# Patient Record
Sex: Female | Born: 1965 | Race: White | Hispanic: No
Health system: Southern US, Community
[De-identification: ages and names within clinical notes are randomized; demographics above are authoritative.]

---

## 2008-08-03 ENCOUNTER — Encounter: Admission: RE | Admit: 2008-08-03 | Discharge: 2008-08-03 | Payer: Self-pay | Admitting: Obstetrics and Gynecology

## 2010-02-03 ENCOUNTER — Encounter: Payer: Self-pay | Admitting: Obstetrics

## 2010-07-10 IMAGING — MG MM DIGITAL DIAGNOSTIC BILAT CAD
6 series · 6 of 6 positions shown · non-contrast
Comparison: 12/18/2004 screening mammogram from [facility].

CLINICAL DATA: Palpable nodule located within the lateral portion
of the right breast.

DIGITAL DIAGNOSTIC  BILATERAL  MAMMOGRAM  WITH CAD AND RIGHT BREAST
ULTRASOUND:

[R CC]
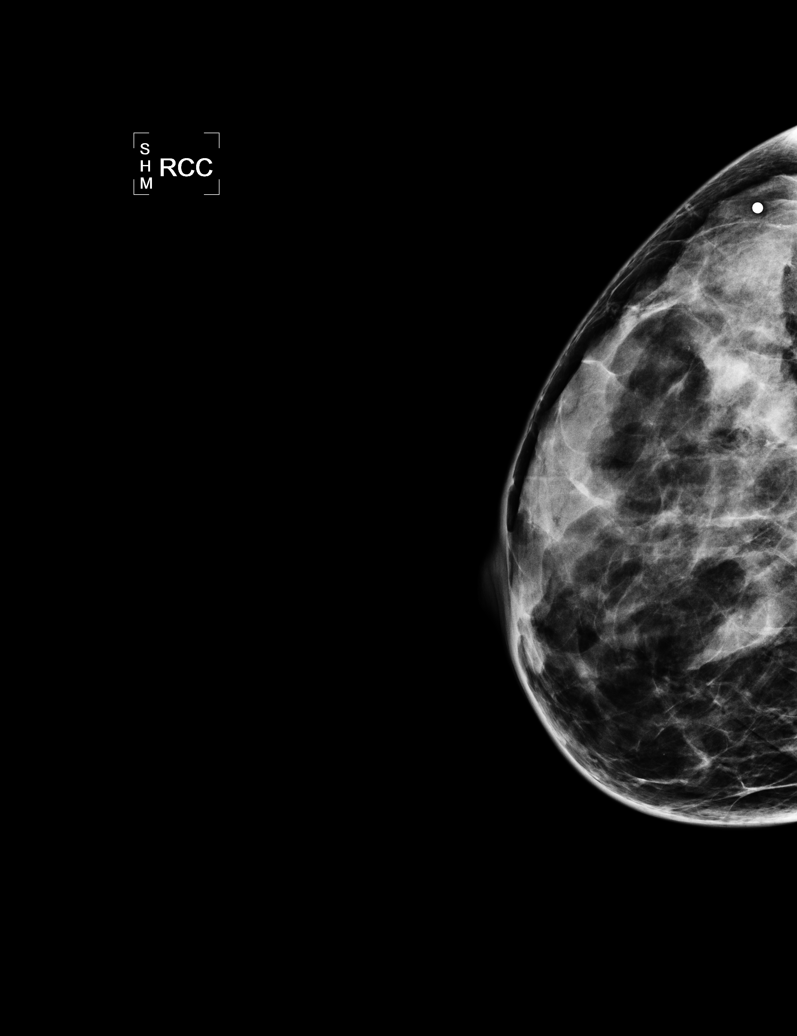

[L CC]
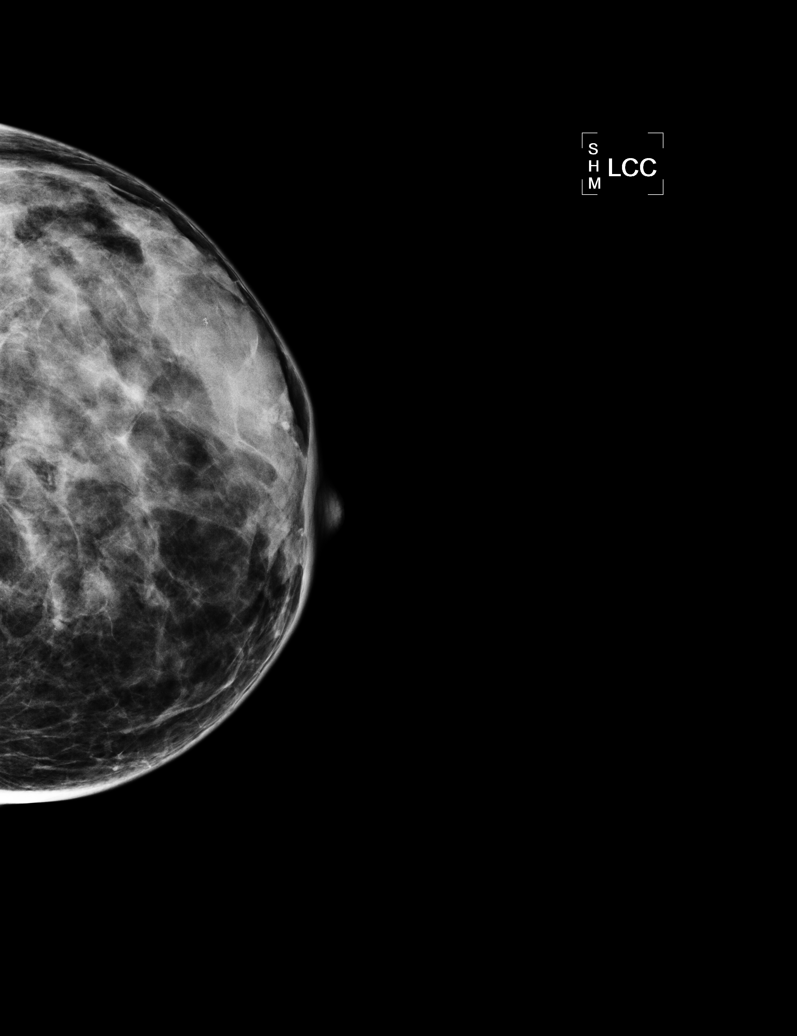

[L MLO]
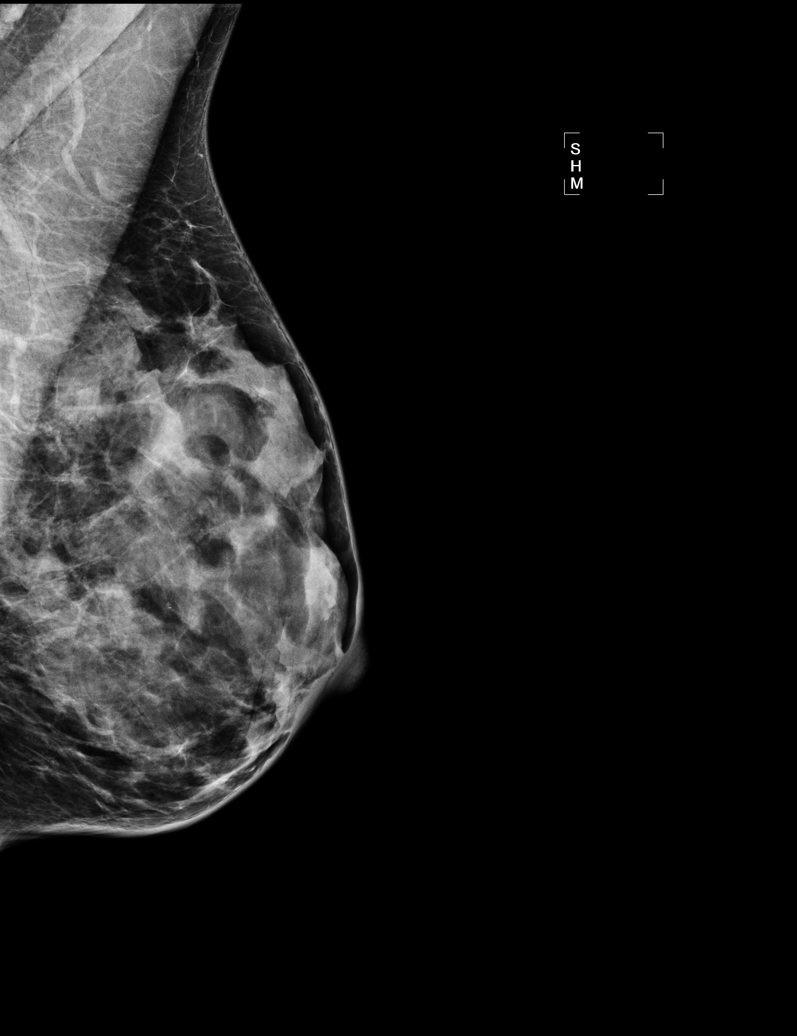

[R MLO]
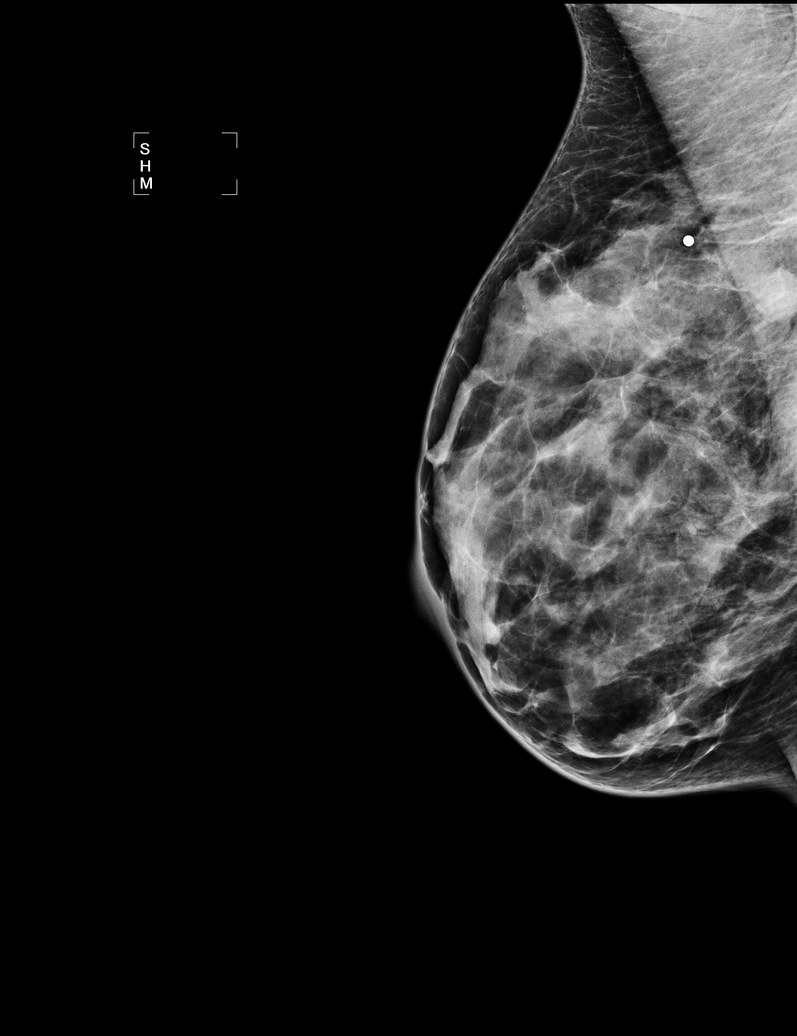

[R TAN]
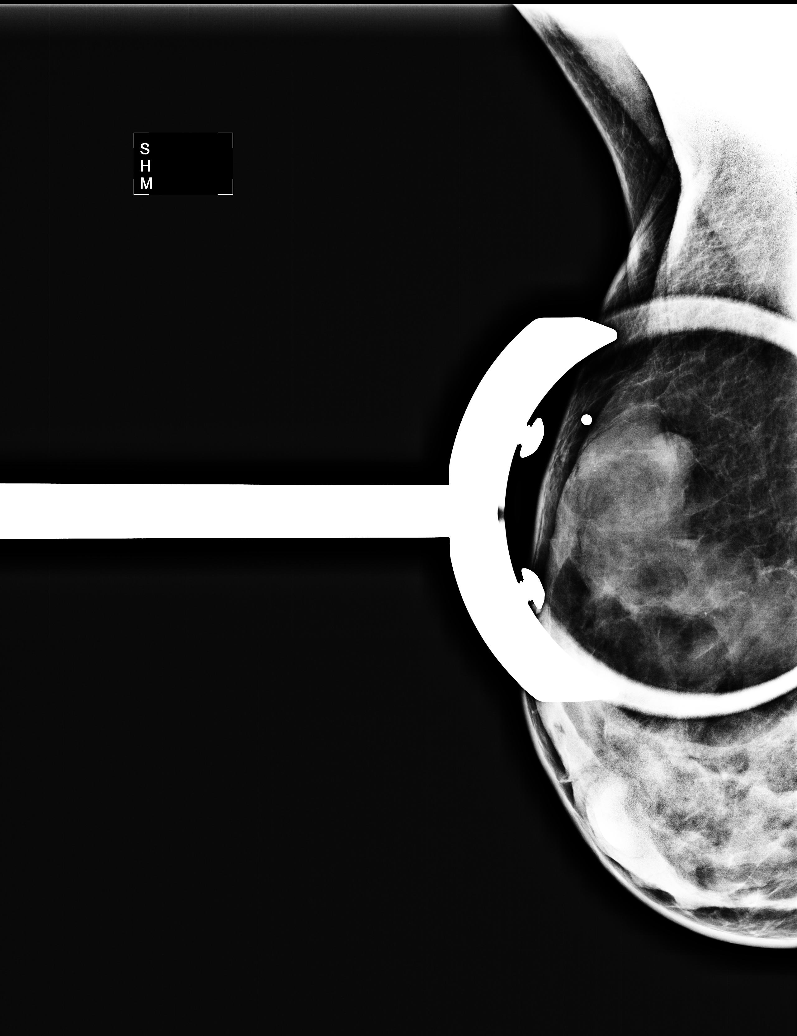

[R XCCL]
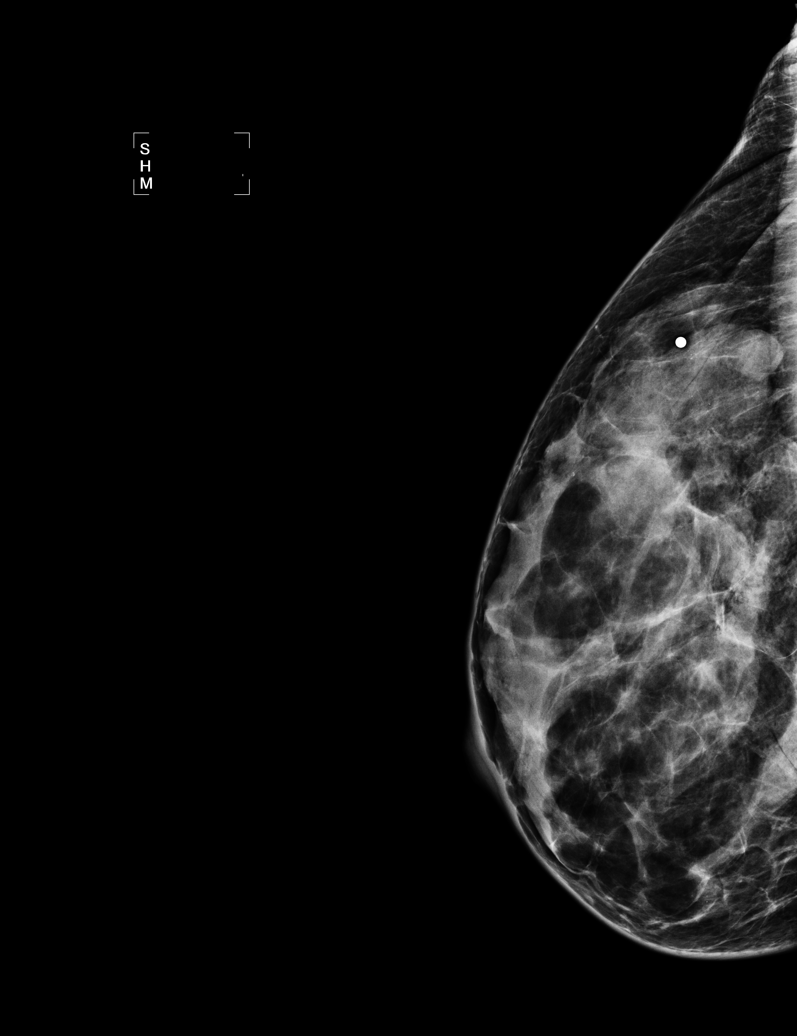

[6 of 6 positions shown; findings below may reference images not displayed]

FINDINGS: There is a heterogeneously dense breast parenchymal
pattern.  There is a round circumscribed nodule located laterally
within the right breast at approximately the 9-10 o'clock position.
There is no distortion or worrisome calcification.
Mammographic images were processed with CAD.

On physical exam, there is a small (approximate 1 cm in size)
mobile palpable nodule located laterally within the right breast at
the [DATE] position.

Ultrasound is performed, showing a simple cyst located within the
right breast at the [DATE] position 9 cm from the nipple measuring
1.1 cm in this size.  There is a smaller adjacent simple cyst
present as well.  There is no solid mass, distortion, or worrisome
shadowing in this region.
IMPRESSION: 1.1 cm in size simple cyst located within the right breast at the
[DATE] position corresponding to the palpable nodule in this region.
There is a smaller adjacent simple cyst present as well.  No
findings worrisome for malignancy.  Recommend screening mammography
in 1 year.

BI-RADS CATEGORY 2:  Benign finding(s).

## 2016-12-10 ENCOUNTER — Telehealth: Payer: Self-pay | Admitting: *Deleted

## 2016-12-10 ENCOUNTER — Ambulatory Visit: Payer: BC Managed Care – PPO | Admitting: Neurology

## 2016-12-10 NOTE — Telephone Encounter (Signed)
Called and LVM asking for return call.   I will be letting her know that Dr. Lucia GaskinsAhern asks for her to see Dr. Terrace ArabiaYan.

## 2016-12-11 ENCOUNTER — Telehealth: Payer: Self-pay | Admitting: *Deleted

## 2016-12-11 NOTE — Telephone Encounter (Signed)
Pt called back, msg relayed. She was very Adult nurseappreciative.

## 2016-12-11 NOTE — Telephone Encounter (Signed)
Called patient and LVM asking for call back.   If she calls back, please tell her that Dr. Lucia GaskinsAhern wanted her to know that she spoke with Daylene KatayamaHilary Gordnier. Skeet SimmerHilary will refill her medicine.

## 2017-03-09 ENCOUNTER — Ambulatory Visit: Payer: BC Managed Care – PPO | Admitting: Neurology

## 2021-12-16 ENCOUNTER — Telehealth: Payer: Self-pay | Admitting: Surgical

## 2021-12-16 NOTE — Telephone Encounter (Signed)
LVM and sent my chart message that Dec. 12 appt with Susy Frizzle Scheeler will need to be rescheduled with Dr. Ulice Bold.  Her first available at the [facility] office would be Jan. 8, 2024 and to please contact our office asap to reschedule or cancel this appt.

## 2021-12-24 ENCOUNTER — Encounter: Payer: Self-pay | Admitting: Surgical
